# Patient Record
Sex: Male | Born: 1990 | Race: Black or African American | Hispanic: No | Marital: Single | Smoking: Never smoker
Health system: Southern US, Community
[De-identification: ages and names within clinical notes are randomized; demographics above are authoritative.]

---

## 2019-12-11 ENCOUNTER — Encounter (HOSPITAL_COMMUNITY): Payer: Self-pay

## 2019-12-11 ENCOUNTER — Emergency Department (HOSPITAL_COMMUNITY): Payer: Self-pay

## 2019-12-11 ENCOUNTER — Other Ambulatory Visit: Payer: Self-pay

## 2019-12-11 ENCOUNTER — Emergency Department (HOSPITAL_COMMUNITY)
Admission: EM | Admit: 2019-12-11 | Discharge: 2019-12-11 | Disposition: A | Discharge status: Home / Self Care | Payer: Self-pay | Attending: Emergency Medicine | Admitting: Emergency Medicine

## 2019-12-11 DIAGNOSIS — Y939 Activity, unspecified: Secondary | ICD-10-CM | POA: Insufficient documentation

## 2019-12-11 DIAGNOSIS — Y929 Unspecified place or not applicable: Secondary | ICD-10-CM | POA: Insufficient documentation

## 2019-12-11 DIAGNOSIS — Y999 Unspecified external cause status: Secondary | ICD-10-CM | POA: Insufficient documentation

## 2019-12-11 DIAGNOSIS — S61411A Laceration without foreign body of right hand, initial encounter: Secondary | ICD-10-CM | POA: Insufficient documentation

## 2019-12-11 DIAGNOSIS — W260XXA Contact with knife, initial encounter: Secondary | ICD-10-CM | POA: Insufficient documentation

## 2019-12-11 MED ORDER — LIDOCAINE-EPINEPHRINE (PF) 2 %-1:200000 IJ SOLN
20.0000 mL | Freq: Once | INTRAMUSCULAR | Status: AC
  Administered 2019-12-11: 20 mL
  Filled 2019-12-11: qty 20

## 2019-12-11 MED ORDER — CEPHALEXIN 500 MG PO CAPS
500.0000 mg | ORAL_CAPSULE | Freq: Two times a day (BID) | ORAL | 0 refills | Status: AC
Start: 2019-12-11 — End: 2019-12-18

## 2019-12-11 MED ORDER — BACITRACIN ZINC 500 UNIT/GM EX OINT
TOPICAL_OINTMENT | Freq: Two times a day (BID) | CUTANEOUS | Status: DC
  Administered 2019-12-11: 1 via TOPICAL
  Filled 2019-12-11: qty 0.9

## 2019-12-11 MED ORDER — OXYCODONE-ACETAMINOPHEN 5-325 MG PO TABS
1.0000 | ORAL_TABLET | Freq: Once | ORAL | Status: AC
  Administered 2019-12-11: 1 via ORAL
  Filled 2019-12-11: qty 1

## 2019-12-11 NOTE — ED Provider Notes (Signed)
[facility]-EMERGENCY DEPT Provider Note   CSN: [id number] Arrival date & time: 12/11/19  0645     History Chief Complaint  Patient presents with  . Laceration    Mathew Reeves is a 29 y.o. male who presents to ED with a chief complaint of laceration.  Just prior to arrival cut his hand on a pocket knife.  States that last tetanus shot was within the past 5 years.  Denies any anticoagulant use.  Did not clean the area prior to arrival.  Denies any other complaints.  HPI     History reviewed. No pertinent past medical history.  There are no problems to display for this patient.    History reviewed. No pertinent family history.  Social History   Tobacco Use  . Smoking status: Not on file  Substance Use Topics  . Alcohol use: Not on file  . Drug use: Not on file    Home Medications Prior to Admission medications   Medication Sig Start Date End Date Taking? Authorizing Provider  cephALEXin (KEFLEX) 500 MG capsule Take 1 capsule (500 mg total) by mouth 2 (two) times daily for 7 days. 12/11/19 12/18/19  Dietrich Pates, PA-C    Allergies    Patient has no known allergies.  Review of Systems   Review of Systems  Constitutional: Negative for chills and fever.  Musculoskeletal: Negative for arthralgias.  Skin: Positive for wound.  Neurological: Negative for weakness and numbness.    Physical Exam Updated Vital Signs BP (!) 145/100 (BP Location: Left Arm)   Pulse 75   Temp 98.3 F (36.8 C) (Oral)   Resp 18   SpO2 100%   Physical Exam Vitals and nursing note reviewed.  Constitutional:      General: He is not in acute distress.    Appearance: He is well-developed. He is not diaphoretic.  HENT:     Head: Normocephalic and atraumatic.  Eyes:     General: No scleral icterus.    Conjunctiva/sclera: Conjunctivae normal.  Pulmonary:     Effort: Pulmonary effort is normal. No respiratory distress.  Musculoskeletal:     Cervical back: Normal  range of motion.  Skin:    Findings: Laceration present. No rash.     Comments: Approximate 4 cm laceration noted to the right medial palm.  Able to flex and extend digits without difficulty.  2+ radial pulse noted bilaterally.  Neurological:     Mental Status: He is alert.     ED Results / Procedures / Treatments   Labs (all labs ordered are listed, but only abnormal results are displayed) Labs Reviewed - No data to display  EKG None  Radiology DG Hand Complete Right  Result Date: 12/11/2019 CLINICAL DATA:  Laceration between the first and second metacarpals. EXAM: RIGHT HAND - COMPLETE 3+ VIEW COMPARISON:  None. FINDINGS: There is no acute fracture or dislocation. No radiopaque foreign body is identified. IMPRESSION: No acute fracture or dislocation. No radiopaque foreign body identified. Electronically Signed   By: Sherian Rein M.D.   On: 12/11/2019 08:36    Procedures .Marland KitchenLaceration Repair  Date/Time: 12/11/2019 9:06 AM Performed by: Dietrich Pates, PA-C Authorized by: Dietrich Pates, PA-C   Consent:    Consent obtained:  Verbal   Consent given by:  Patient   Risks discussed:  Infection, need for additional repair, nerve damage, pain, poor cosmetic result, poor wound healing, retained foreign body, tendon damage and vascular damage   Alternatives discussed:  No treatment Anesthesia (  see MAR for exact dosages):    Anesthesia method:  Local infiltration   Local anesthetic:  Lidocaine 2% WITH epi Laceration details:    Location:  Hand   Hand location:  R palm   Length (cm):  4 Repair type:    Repair type:  Simple Pre-procedure details:    Preparation:  Patient was prepped and draped in usual sterile fashion Exploration:    Hemostasis achieved with:  Direct pressure   Wound exploration: wound explored through full range of motion     Contaminated: yes   Treatment:    Area cleansed with:  Saline   Amount of cleaning:  Extensive   Irrigation solution:  Sterile saline    Irrigation method:  Syringe Skin repair:    Repair method:  Sutures   Suture size:  5-0   Suture material:  Nylon   Suture technique:  Simple interrupted   Number of sutures:  5 Approximation:    Approximation:  Close Post-procedure details:    Dressing:  Antibiotic ointment   Patient tolerance of procedure:  Tolerated well, no immediate complications   (including critical care time)  Medications Ordered in ED Medications  bacitracin ointment (has no administration in time range)  lidocaine-EPINEPHrine (XYLOCAINE W/EPI) 2 %-1:200000 (PF) injection 20 mL (20 mLs Infiltration Given by Other 12/11/19 0743)  oxyCODONE-acetaminophen (PERCOCET/ROXICET) 5-325 MG per tablet 1 tablet (1 tablet Oral Given 12/11/19 8588)    ED Course  I have reviewed the triage vital signs and the nursing notes.  Pertinent labs & imaging results that were available during my care of the patient were reviewed by me and considered in my medical decision making (see chart for details).    MDM Rules/Calculators/A&P                          Pressure irrigation performed. Wound explored and base of wound visualized in a bloodless field without evidence of foreign body.  Laceration occurred < 8 hours prior to repair which was well tolerated.Tdap up to date.  Pt has  no comorbidities to effect normal wound healing. Pt discharged  with antibiotics due to location of laceration possible contamination. Patient counseled on wound care. Patient counseled on need to return or see PCP/urgent care for suture removal in 7 to 10 days days. Patient was urged to return to the Emergency Department urgently with worsening pain, swelling, expanding erythema especially if it streaks away from the affected area, fever, or if they have any other concerns. Patient verbalized understanding.   All imaging, if done today, including plain films, CT scans, and ultrasounds, independently reviewed by me, and interpretations confirmed via formal  radiology reads.  Patient is hemodynamically stable, in NAD, and able to ambulate in the ED. Evaluation does not show pathology that would require ongoing emergent intervention or inpatient treatment. I explained the diagnosis to the patient. Pain has been managed and has no complaints prior to discharge. Patient is comfortable with above plan and is stable for discharge at this time. All questions were answered prior to disposition. Strict return precautions for returning to the ED were discussed. Encouraged follow up with PCP.   An After Visit Summary was printed and given to the patient.   Portions of this note were generated with Scientist, clinical (histocompatibility and immunogenetics). Dictation errors may occur despite best attempts at proofreading.  Final Clinical Impression(s) / ED Diagnoses Final diagnoses:  Laceration of right hand without foreign body, initial encounter  Rx / DC Orders ED Discharge Orders         Ordered    cephALEXin (KEFLEX) 500 MG capsule  2 times daily     Discontinue  Reprint     12/11/19 0906           Dietrich Pates, PA-C 12/11/19 1540    Lorre Nick, MD 12/12/19 1535

## 2019-12-11 NOTE — ED Triage Notes (Addendum)
Pt states that he cut his R palm on a pocket knife about 20 mins ago. Lac noted about 3.5cm long. Pt reports that his last tetanus shot was March of last year.

## 2019-12-11 NOTE — Discharge Instructions (Signed)
You will need your sutures removed in 7 to 10 days. Return sooner for signs of infection including redness, swelling, drainage or fever. Follow instructions regarding wound care. Take antibiotics as directed.

## 2020-01-23 ENCOUNTER — Emergency Department (HOSPITAL_COMMUNITY)
Admission: EM | Admit: 2020-01-23 | Discharge: 2020-01-23 | Disposition: A | Discharge status: Home / Self Care | Payer: Self-pay | Attending: Emergency Medicine | Admitting: Emergency Medicine

## 2020-01-23 ENCOUNTER — Other Ambulatory Visit: Payer: Self-pay

## 2020-01-23 ENCOUNTER — Emergency Department (HOSPITAL_COMMUNITY): Payer: Self-pay

## 2020-01-23 DIAGNOSIS — R059 Cough, unspecified: Secondary | ICD-10-CM

## 2020-01-23 DIAGNOSIS — R509 Fever, unspecified: Secondary | ICD-10-CM | POA: Insufficient documentation

## 2020-01-23 DIAGNOSIS — Z5321 Procedure and treatment not carried out due to patient leaving prior to being seen by health care provider: Secondary | ICD-10-CM | POA: Insufficient documentation

## 2020-01-23 DIAGNOSIS — R05 Cough: Secondary | ICD-10-CM | POA: Insufficient documentation

## 2020-01-23 NOTE — ED Notes (Signed)
Called for a treatment, no answer.  Informed by staff that pt has left.

## 2020-01-23 NOTE — ED Triage Notes (Signed)
Pt presents with reports of Covid symptoms. Pt reports exposure to someone with Covid, highest temp at home of 103. Pt reports a cough as well.

## 2021-09-10 IMAGING — DX DG HAND COMPLETE 3+V*R*
3 series · 3 of 3 positions shown · non-contrast
Comparison: None.

CLINICAL DATA: Laceration between the first and second metacarpals.

EXAM:
RIGHT HAND - COMPLETE 3+ VIEW

[hand ap]
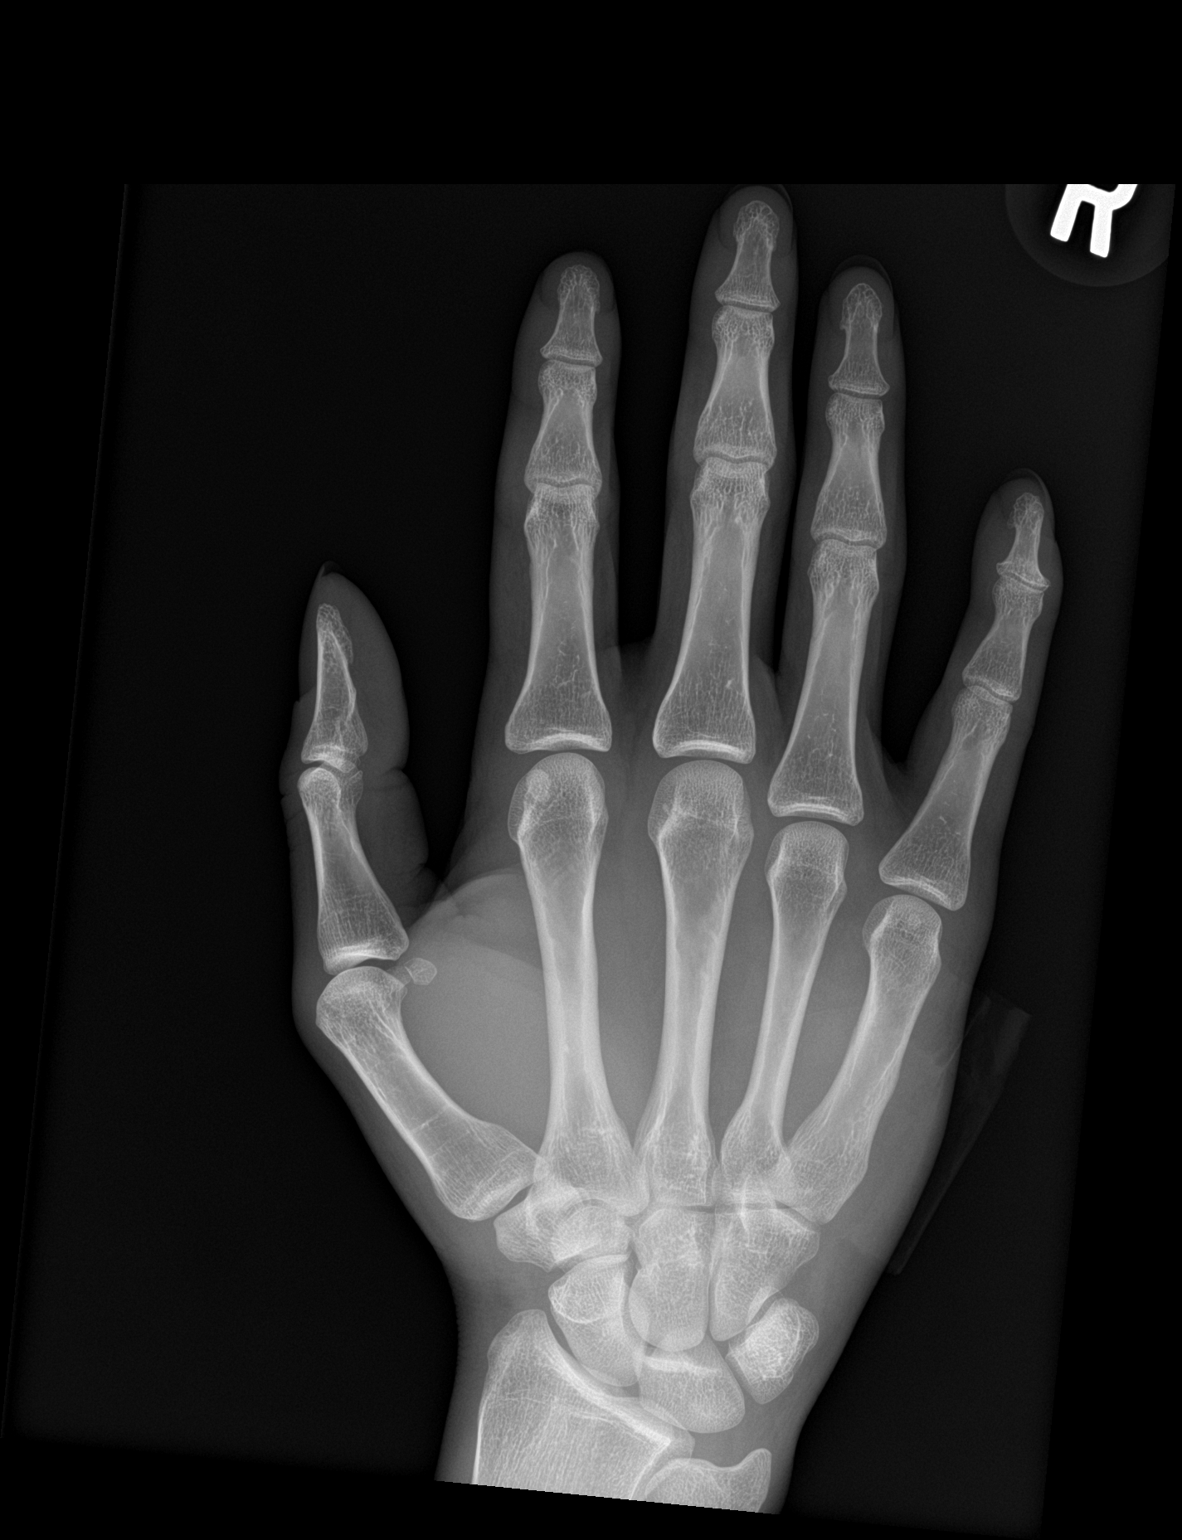

[hand obl]
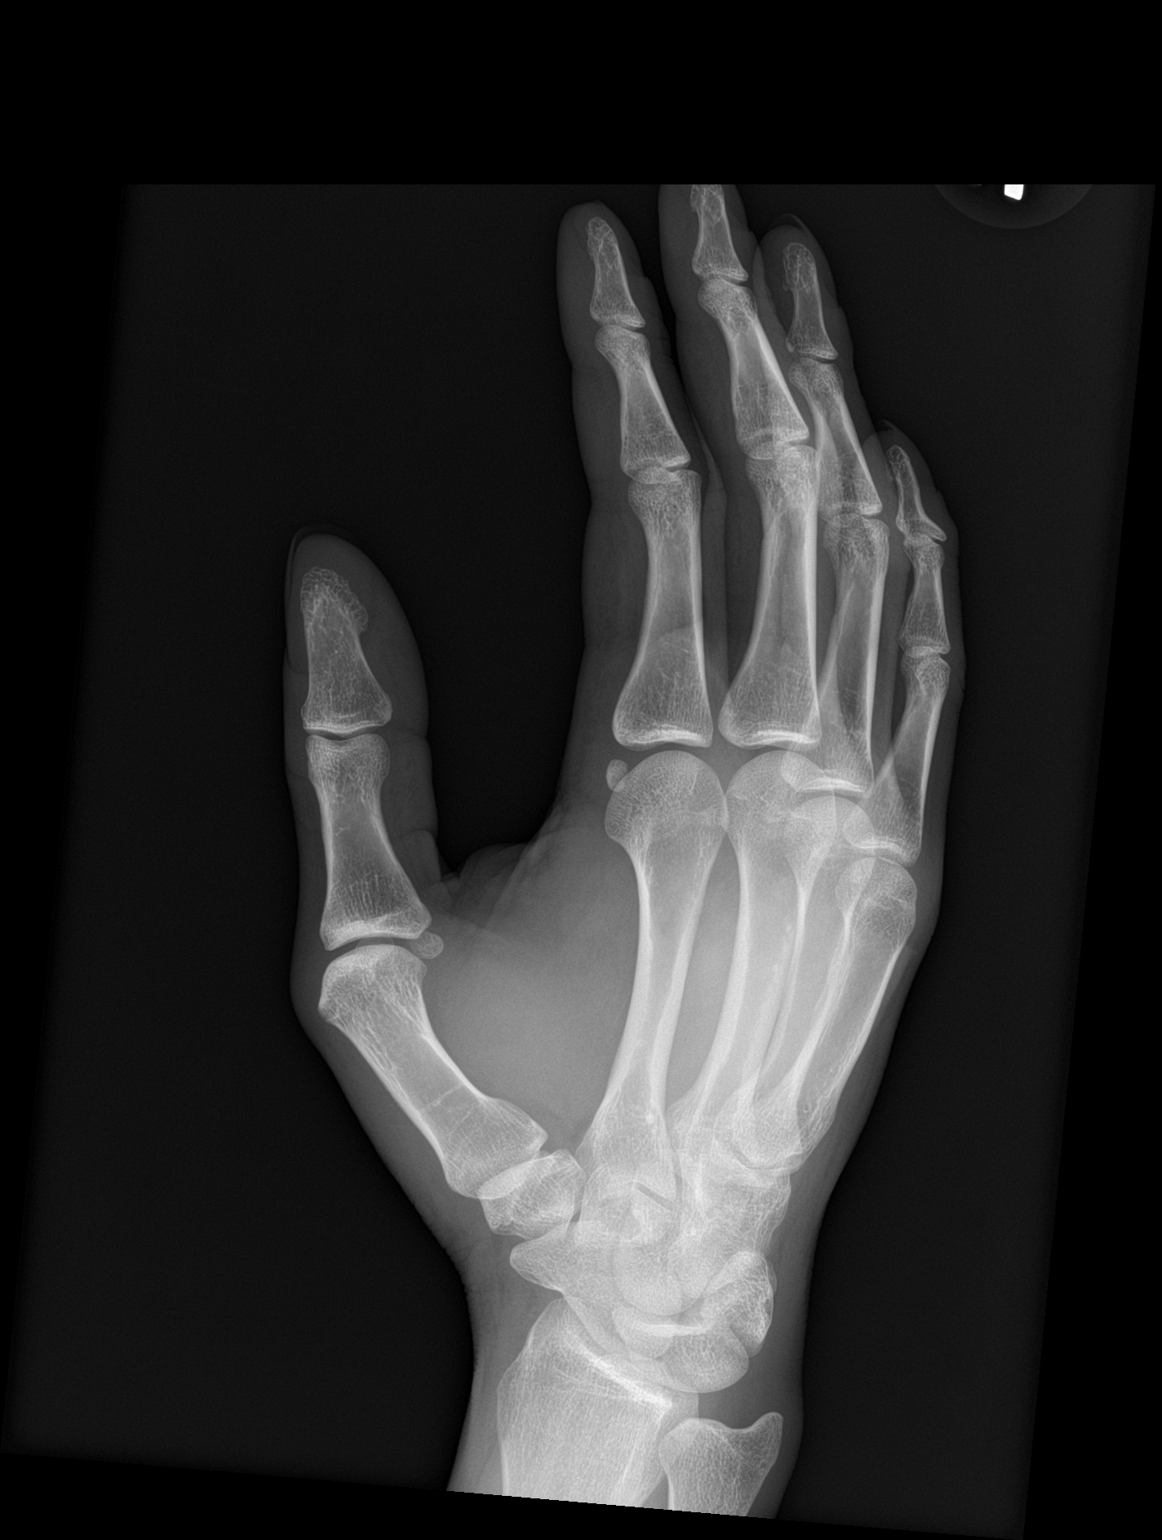

[hand lat]
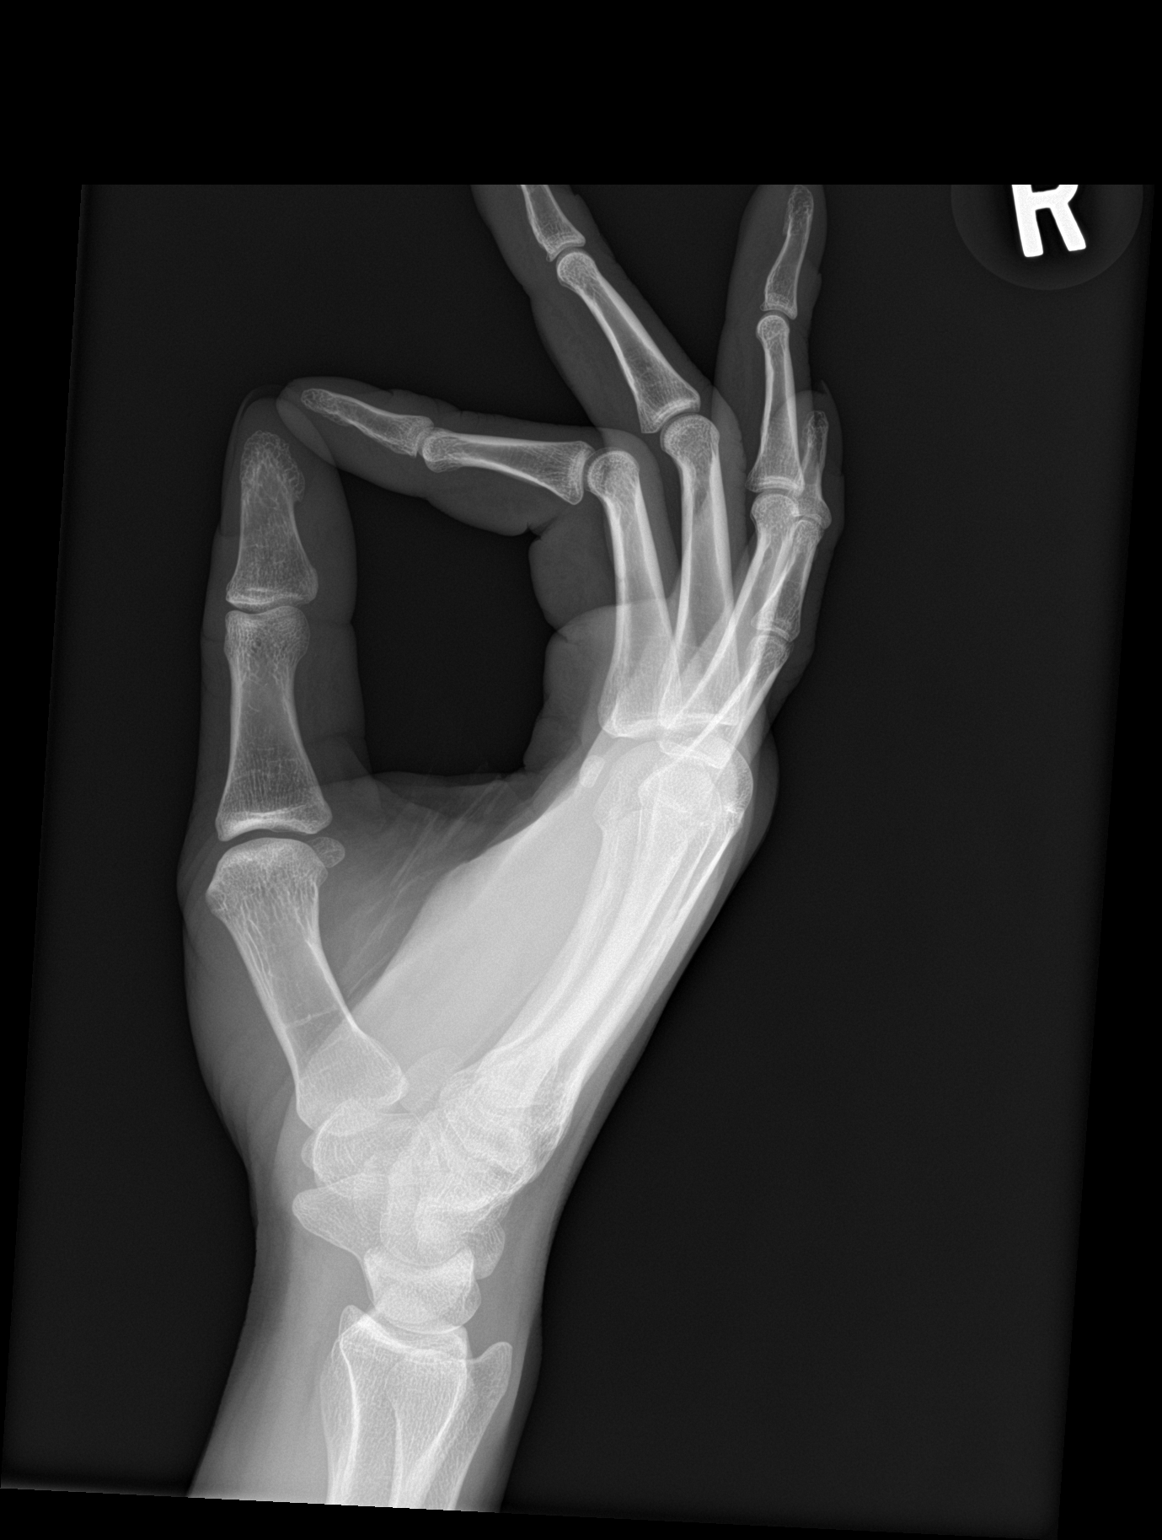

[3 of 3 positions shown; findings below may reference images not displayed]

FINDINGS: There is no acute fracture or dislocation. No radiopaque foreign
body is identified.
IMPRESSION: No acute fracture or dislocation. No radiopaque foreign body
identified.

## 2023-12-22 ENCOUNTER — Emergency Department (HOSPITAL_BASED_OUTPATIENT_CLINIC_OR_DEPARTMENT_OTHER)
Admission: EM | Admit: 2023-12-22 | Discharge: 2023-12-22 | Disposition: A | Discharge status: Home / Self Care | Payer: Self-pay | Attending: Emergency Medicine | Admitting: Emergency Medicine

## 2023-12-22 ENCOUNTER — Other Ambulatory Visit: Payer: Self-pay

## 2023-12-22 ENCOUNTER — Encounter (HOSPITAL_BASED_OUTPATIENT_CLINIC_OR_DEPARTMENT_OTHER): Payer: Self-pay | Admitting: *Deleted

## 2023-12-22 DIAGNOSIS — S86912A Strain of unspecified muscle(s) and tendon(s) at lower leg level, left leg, initial encounter: Secondary | ICD-10-CM | POA: Insufficient documentation

## 2023-12-22 DIAGNOSIS — S56912A Strain of unspecified muscles, fascia and tendons at forearm level, left arm, initial encounter: Secondary | ICD-10-CM | POA: Diagnosis not present

## 2023-12-22 DIAGNOSIS — Y92410 Unspecified street and highway as the place of occurrence of the external cause: Secondary | ICD-10-CM | POA: Diagnosis not present

## 2023-12-22 DIAGNOSIS — T148XXA Other injury of unspecified body region, initial encounter: Secondary | ICD-10-CM

## 2023-12-22 DIAGNOSIS — S39012A Strain of muscle, fascia and tendon of lower back, initial encounter: Secondary | ICD-10-CM | POA: Diagnosis not present

## 2023-12-22 DIAGNOSIS — S3992XA Unspecified injury of lower back, initial encounter: Secondary | ICD-10-CM | POA: Diagnosis present

## 2023-12-22 MED ORDER — LIDOCAINE 5 % EX PTCH: 1.0000 | MEDICATED_PATCH | CUTANEOUS | 0 refills | Status: AC

## 2023-12-22 MED ORDER — METHOCARBAMOL 500 MG PO TABS: 500.0000 mg | ORAL_TABLET | Freq: Two times a day (BID) | ORAL | 0 refills | Status: AC
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
## 2023-12-22 NOTE — Discharge Instructions (Addendum)
 I have sent a muscle relaxer to the pharmacy for you.  This will make you drowsy.  Do not drive or do anything else dangerous after taking this medicine I have also sent in lidocaine  patch.  Apply this to 1 area that is bothering you the most.  You likely have muscle strain and this should resolve in the next several days.  Exam is overall reassuring.  Take ibuprofen 600 mg 3 times a day for the next 5-7 days.  In addition to this you can take Tylenol  1000 mg every 6 hours.
